# Patient Record
Sex: Male | Born: 1955 | Race: Black or African American | Hispanic: No | Marital: Single | Smoking: Former smoker
Health system: Southern US, Community
[De-identification: ages and names within clinical notes are randomized; demographics above are authoritative.]

## PROBLEM LIST (undated history)

## (undated) DIAGNOSIS — I10 Essential (primary) hypertension: Secondary | ICD-10-CM

## (undated) DIAGNOSIS — N183 Chronic kidney disease, stage 3 unspecified: Secondary | ICD-10-CM

## (undated) DIAGNOSIS — L97509 Non-pressure chronic ulcer of other part of unspecified foot with unspecified severity: Secondary | ICD-10-CM

## (undated) DIAGNOSIS — E11621 Type 2 diabetes mellitus with foot ulcer: Secondary | ICD-10-CM

## (undated) DIAGNOSIS — I509 Heart failure, unspecified: Secondary | ICD-10-CM

## (undated) DIAGNOSIS — D649 Anemia, unspecified: Secondary | ICD-10-CM

## (undated) HISTORY — DX: Type 2 diabetes mellitus with foot ulcer: E11.621

## (undated) HISTORY — DX: Non-pressure chronic ulcer of other part of unspecified foot with unspecified severity: L97.509

## (undated) HISTORY — DX: Chronic kidney disease, stage 3 (moderate): N18.3

## (undated) HISTORY — DX: Chronic kidney disease, stage 3 unspecified: N18.30

## (undated) HISTORY — DX: Essential (primary) hypertension: I10

## (undated) HISTORY — DX: Anemia, unspecified: D64.9

## (undated) HISTORY — PX: CARDIAC CATHETERIZATION: SHX172

## (undated) HISTORY — DX: Heart failure, unspecified: I50.9

---

## 2010-06-18 ENCOUNTER — Encounter (INDEPENDENT_AMBULATORY_CARE_PROVIDER_SITE_OTHER): Payer: Self-pay | Admitting: Internal Medicine

## 2010-06-18 ENCOUNTER — Ambulatory Visit: Payer: Self-pay | Admitting: Cardiology

## 2010-06-18 ENCOUNTER — Inpatient Hospital Stay (HOSPITAL_COMMUNITY): Admission: EM | Admit: 2010-06-18 | Discharge: 2010-06-25 | Payer: Self-pay | Admitting: Emergency Medicine

## 2010-06-19 ENCOUNTER — Encounter (INDEPENDENT_AMBULATORY_CARE_PROVIDER_SITE_OTHER): Payer: Self-pay | Admitting: Internal Medicine

## 2010-07-17 ENCOUNTER — Ambulatory Visit: Payer: Self-pay | Admitting: Cardiovascular Disease

## 2010-10-09 LAB — URINALYSIS, ROUTINE W REFLEX MICROSCOPIC
Glucose, UA: >1000 mg/dL — AB
Hgb urine dipstick: NEGATIVE
Leukocytes, UA: NEGATIVE
Specific Gravity, Urine: >1.046 — ABNORMAL HIGH (ref 1.005–1.030)
Urobilinogen, UA: 2 mg/dL — ABNORMAL HIGH (ref 0.0–1.0)

## 2010-10-09 LAB — GLUCOSE, CAPILLARY
Glucose-Capillary: 100 mg/dL — ABNORMAL HIGH (ref 70–99)
Glucose-Capillary: 114 mg/dL — ABNORMAL HIGH (ref 70–99)
Glucose-Capillary: 114 mg/dL — ABNORMAL HIGH (ref 70–99)
Glucose-Capillary: 131 mg/dL — ABNORMAL HIGH (ref 70–99)
Glucose-Capillary: 141 mg/dL — ABNORMAL HIGH (ref 70–99)
Glucose-Capillary: 169 mg/dL — ABNORMAL HIGH (ref 70–99)
Glucose-Capillary: 170 mg/dL — ABNORMAL HIGH (ref 70–99)
Glucose-Capillary: 178 mg/dL — ABNORMAL HIGH (ref 70–99)
Glucose-Capillary: 192 mg/dL — ABNORMAL HIGH (ref 70–99)
Glucose-Capillary: 193 mg/dL — ABNORMAL HIGH (ref 70–99)
Glucose-Capillary: 199 mg/dL — ABNORMAL HIGH (ref 70–99)
Glucose-Capillary: 254 mg/dL — ABNORMAL HIGH (ref 70–99)
Glucose-Capillary: 340 mg/dL — ABNORMAL HIGH (ref 70–99)

## 2010-10-09 LAB — DIFFERENTIAL
Basophils Absolute: 0 10*3/uL (ref 0.0–0.1)
Basophils Absolute: 0 10*3/uL (ref 0.0–0.1)
Basophils Relative: 0 % (ref 0–1)
Basophils Relative: 0 % (ref 0–1)
Eosinophils Absolute: 0.3 10*3/uL (ref 0.0–0.7)
Eosinophils Relative: 0 % (ref 0–5)
Lymphocytes Relative: 10 % — ABNORMAL LOW (ref 12–46)
Lymphocytes Relative: 8 % — ABNORMAL LOW (ref 12–46)
Monocytes Relative: 4 % (ref 3–12)
Neutro Abs: 10.6 10*3/uL — ABNORMAL HIGH (ref 1.7–7.7)
Neutro Abs: 9.5 10*3/uL — ABNORMAL HIGH (ref 1.7–7.7)
Neutrophils Relative %: 79 % — ABNORMAL HIGH (ref 43–77)
Neutrophils Relative %: 80 % — ABNORMAL HIGH (ref 43–77)
Neutrophils Relative %: 88 % — ABNORMAL HIGH (ref 43–77)

## 2010-10-09 LAB — BASIC METABOLIC PANEL
BUN: 16 mg/dL (ref 6–23)
BUN: 16 mg/dL (ref 6–23)
CO2: 24 mEq/L (ref 19–32)
CO2: 27 mEq/L (ref 19–32)
CO2: 27 mEq/L (ref 19–32)
Calcium: 7.7 mg/dL — ABNORMAL LOW (ref 8.4–10.5)
Calcium: 8.3 mg/dL — ABNORMAL LOW (ref 8.4–10.5)
Calcium: 8.5 mg/dL (ref 8.4–10.5)
Calcium: 8.7 mg/dL (ref 8.4–10.5)
Chloride: 100 mEq/L (ref 96–112)
Chloride: 101 mEq/L (ref 96–112)
Chloride: 104 mEq/L (ref 96–112)
Chloride: 105 mEq/L (ref 96–112)
Chloride: 93 mEq/L — ABNORMAL LOW (ref 96–112)
Creatinine, Ser: 1.07 mg/dL (ref 0.4–1.5)
Creatinine, Ser: 1.19 mg/dL (ref 0.4–1.5)
Creatinine, Ser: 1.23 mg/dL (ref 0.4–1.5)
Creatinine, Ser: 1.32 mg/dL (ref 0.4–1.5)
GFR calc Af Amer: >60 mL/min (ref >60)
GFR calc Af Amer: >60 mL/min (ref >60)
GFR calc Af Amer: >60 mL/min (ref >60)
GFR calc Af Amer: >60 mL/min (ref >60)
GFR calc non Af Amer: 58 mL/min — ABNORMAL LOW (ref >60)
Glucose, Bld: 187 mg/dL — ABNORMAL HIGH (ref 70–99)
Potassium: 3.7 mEq/L (ref 3.5–5.1)
Potassium: 4.1 mEq/L (ref 3.5–5.1)
Sodium: 136 mEq/L (ref 135–145)
Sodium: 137 mEq/L (ref 135–145)
Sodium: 138 mEq/L (ref 135–145)

## 2010-10-09 LAB — RENAL FUNCTION PANEL
Albumin: 2.3 g/dL — ABNORMAL LOW (ref 3.5–5.2)
BUN: 24 mg/dL — ABNORMAL HIGH (ref 6–23)
Creatinine, Ser: 1.21 mg/dL (ref 0.4–1.5)
Phosphorus: 3.5 mg/dL (ref 2.3–4.6)

## 2010-10-09 LAB — COMPREHENSIVE METABOLIC PANEL
ALT: 15 U/L (ref 0–53)
Alkaline Phosphatase: 72 U/L (ref 39–117)
BUN: 29 mg/dL — ABNORMAL HIGH (ref 6–23)
CO2: 28 mEq/L (ref 19–32)
Chloride: 97 mEq/L (ref 96–112)
GFR calc non Af Amer: 46 mL/min — ABNORMAL LOW (ref >60)
Glucose, Bld: 134 mg/dL — ABNORMAL HIGH (ref 70–99)
Potassium: 3.3 mEq/L — ABNORMAL LOW (ref 3.5–5.1)
Sodium: 136 mEq/L (ref 135–145)
Total Bilirubin: 0.9 mg/dL (ref 0.3–1.2)

## 2010-10-09 LAB — CBC
HCT: 29.5 % — ABNORMAL LOW (ref 39.0–52.0)
HCT: 31.1 % — ABNORMAL LOW (ref 39.0–52.0)
HCT: 32.4 % — ABNORMAL LOW (ref 39.0–52.0)
HCT: 34 % — ABNORMAL LOW (ref 39.0–52.0)
Hemoglobin: 10.1 g/dL — ABNORMAL LOW (ref 13.0–17.0)
Hemoglobin: 11.1 g/dL — ABNORMAL LOW (ref 13.0–17.0)
Hemoglobin: 9.5 g/dL — ABNORMAL LOW (ref 13.0–17.0)
MCH: 28 pg (ref 26.0–34.0)
MCH: 28.7 pg (ref 26.0–34.0)
MCH: 28.9 pg (ref 26.0–34.0)
MCHC: 31.4 g/dL (ref 30.0–36.0)
MCHC: 32.5 g/dL (ref 30.0–36.0)
MCV: 87.8 fL (ref 78.0–100.0)
MCV: 87.9 fL (ref 78.0–100.0)
MCV: 88.4 fL (ref 78.0–100.0)
MCV: 88.5 fL (ref 78.0–100.0)
MCV: 89 fL (ref 78.0–100.0)
Platelets: 259 10*3/uL (ref 150–400)
Platelets: 287 10*3/uL (ref 150–400)
Platelets: 291 10*3/uL (ref 150–400)
Platelets: 295 10*3/uL (ref 150–400)
Platelets: 301 10*3/uL (ref 150–400)
Platelets: 330 10*3/uL (ref 150–400)
RBC: 3.36 MIL/uL — ABNORMAL LOW (ref 4.22–5.81)
RBC: 3.5 MIL/uL — ABNORMAL LOW (ref 4.22–5.81)
RBC: 3.51 MIL/uL — ABNORMAL LOW (ref 4.22–5.81)
RBC: 3.66 MIL/uL — ABNORMAL LOW (ref 4.22–5.81)
RBC: 3.87 MIL/uL — ABNORMAL LOW (ref 4.22–5.81)
RDW: 12.6 % (ref 11.5–15.5)
RDW: 12.7 % (ref 11.5–15.5)
RDW: 12.7 % (ref 11.5–15.5)
RDW: 12.7 % (ref 11.5–15.5)
WBC: 10.3 10*3/uL (ref 4.0–10.5)
WBC: 12 10*3/uL — ABNORMAL HIGH (ref 4.0–10.5)
WBC: 13.4 10*3/uL — ABNORMAL HIGH (ref 4.0–10.5)
WBC: 7.4 10*3/uL (ref 4.0–10.5)
WBC: 7.9 10*3/uL (ref 4.0–10.5)

## 2010-10-09 LAB — IRON AND TIBC
Saturation Ratios: 10 % — ABNORMAL LOW (ref 20–55)
TIBC: 177 ug/dL — ABNORMAL LOW (ref 215–435)

## 2010-10-09 LAB — CK TOTAL AND CKMB (NOT AT ARMC)
CK, MB: 3.5 ng/mL (ref 0.3–4.0)
Relative Index: 1.4 (ref 0.0–2.5)
Total CK: 257 U/L — ABNORMAL HIGH (ref 7–232)

## 2010-10-09 LAB — CULTURE, BLOOD (ROUTINE X 2): Culture: NO GROWTH

## 2010-10-09 LAB — APTT: aPTT: 29 seconds (ref 24–37)

## 2010-10-09 LAB — HEPARIN LEVEL (UNFRACTIONATED)
Heparin Unfractionated: 0.12 IU/mL — ABNORMAL LOW (ref 0.30–0.70)
Heparin Unfractionated: 0.41 IU/mL (ref 0.30–0.70)

## 2010-10-09 LAB — MRSA PCR SCREENING: MRSA by PCR: NEGATIVE

## 2010-10-09 LAB — URINE MICROSCOPIC-ADD ON

## 2010-10-09 LAB — LIPID PANEL
LDL Cholesterol: 62 mg/dL (ref 0–99)
Total CHOL/HDL Ratio: 3.3 RATIO
Triglycerides: 59 mg/dL (ref <150)
VLDL: 12 mg/dL (ref 0–40)

## 2010-10-09 LAB — RAPID URINE DRUG SCREEN, HOSP PERFORMED
Amphetamines: NOT DETECTED
Barbiturates: NOT DETECTED

## 2010-10-09 LAB — MAGNESIUM: Magnesium: 1.5 mg/dL (ref 1.5–2.5)

## 2010-10-09 LAB — TROPONIN I
Troponin I: 0.04 ng/mL (ref 0.00–0.06)
Troponin I: 0.1 ng/mL — ABNORMAL HIGH (ref 0.00–0.06)

## 2010-10-09 LAB — PROTIME-INR
INR: 1.36 (ref 0.00–1.49)
Prothrombin Time: 15.5 seconds — ABNORMAL HIGH (ref 11.6–15.2)

## 2010-10-09 LAB — RETICULOCYTES
Retic Count, Absolute: 42.1 10*3/uL (ref 19.0–186.0)
Retic Ct Pct: 1.2 % (ref 0.4–3.1)

## 2010-10-09 LAB — VITAMIN B12: Vitamin B-12: 862 pg/mL (ref 211–911)

## 2010-10-09 LAB — D-DIMER, QUANTITATIVE: D-Dimer, Quant: 1.07 ug/mL-FEU — ABNORMAL HIGH (ref 0.00–0.48)

## 2010-10-09 LAB — CARDIAC PANEL(CRET KIN+CKTOT+MB+TROPI)
Relative Index: 1.1 (ref 0.0–2.5)
Total CK: 321 U/L — ABNORMAL HIGH (ref 7–232)

## 2010-10-09 LAB — VANCOMYCIN, TROUGH: Vancomycin Tr: 14.8 ug/mL (ref 10.0–20.0)

## 2011-11-19 ENCOUNTER — Encounter: Payer: Self-pay | Admitting: *Deleted

## 2012-09-07 IMAGING — CR DG CHEST 2V
2 series · 2 of 2 positions shown · non-contrast
Comparison: 06/17/2010

CLINICAL DATA: CHF

CHEST - 2 VIEW

[w chest pa]
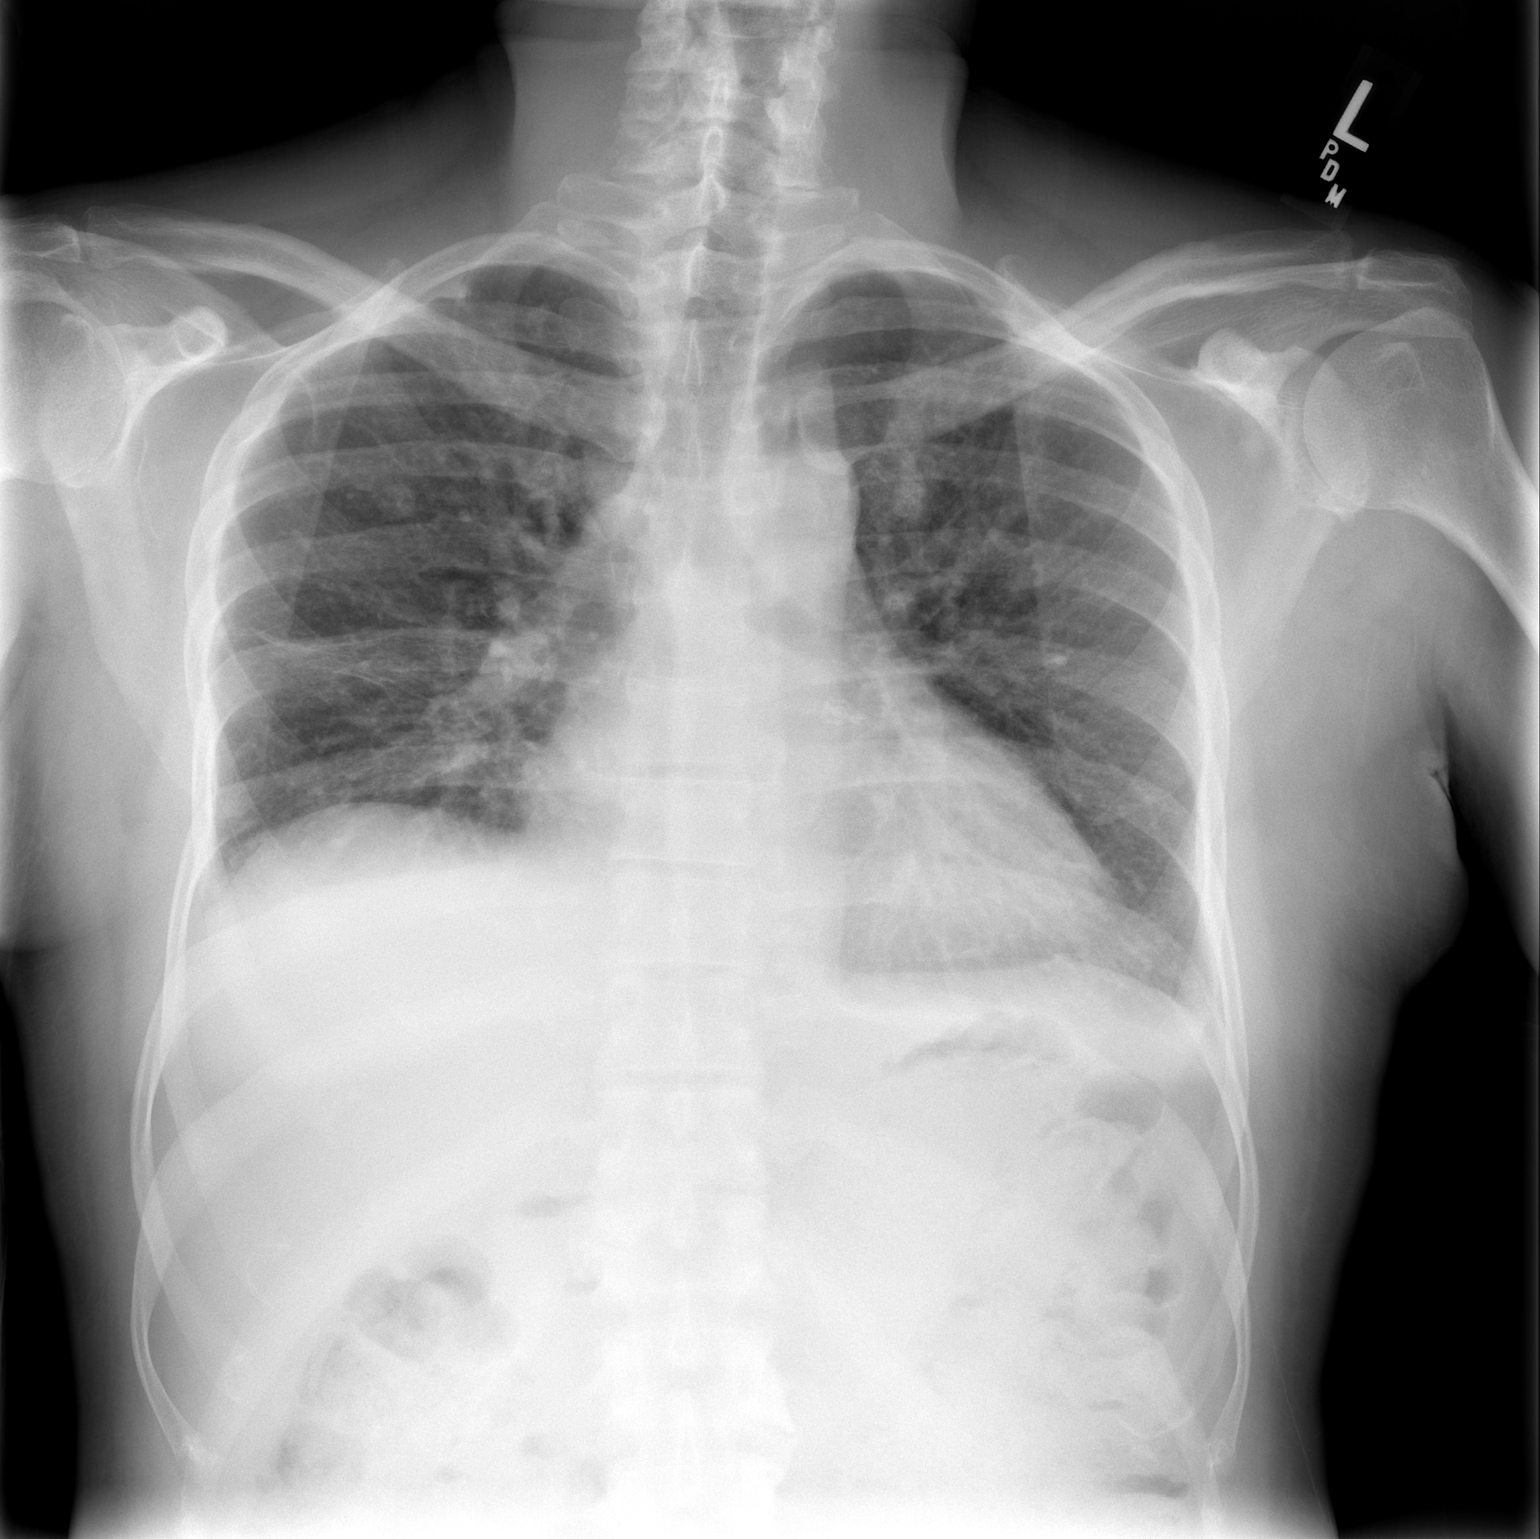

[w chest lat]
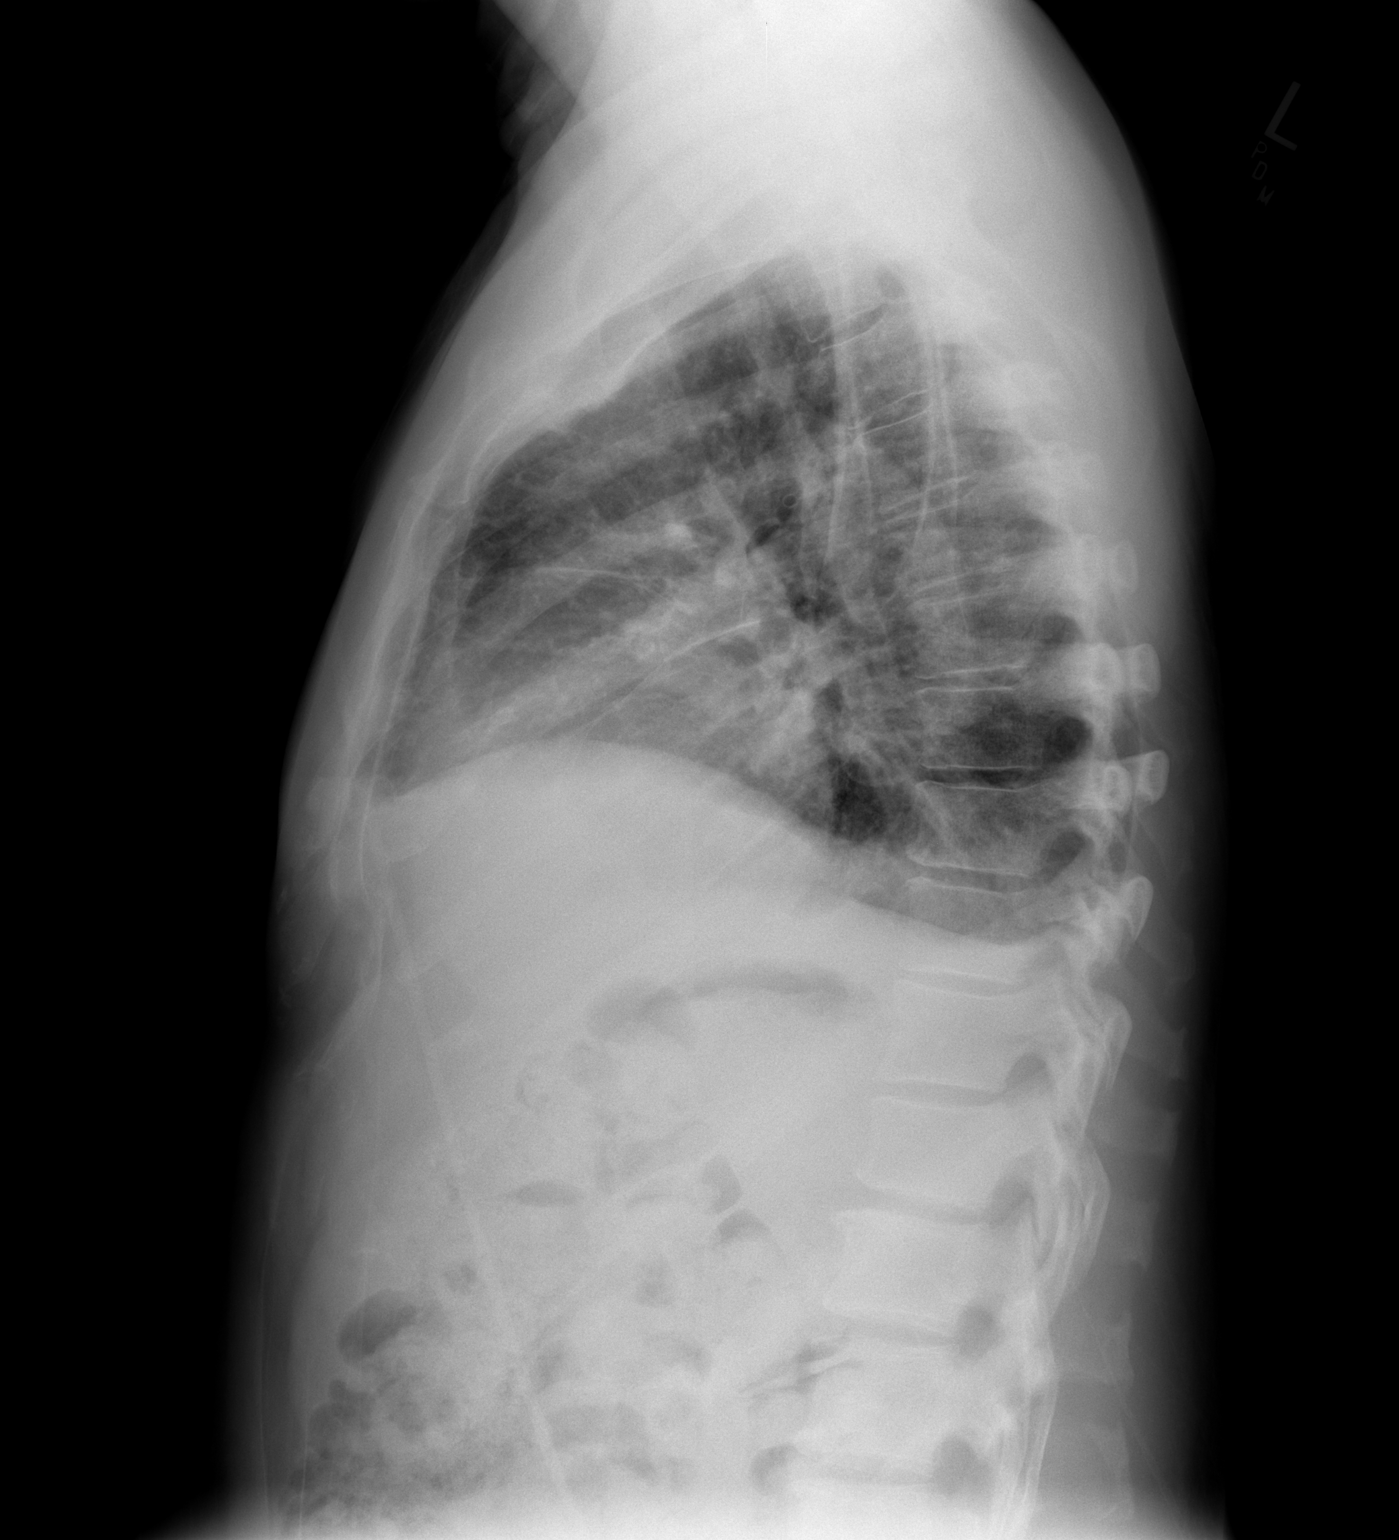

[2 of 2 positions shown; findings below may reference images not displayed]

FINDINGS: The heart is mildly enlarged.  Pulmonary edema and low
volumes have improved.  Tiny pleural effusions are stable.  No
pneumothorax.
IMPRESSION: Improved edema.

## 2014-08-07 ENCOUNTER — Emergency Department (HOSPITAL_COMMUNITY)
Admission: EM | Admit: 2014-08-07 | Discharge: 2014-08-29 | Disposition: E | Payer: Self-pay | Attending: Emergency Medicine | Admitting: Emergency Medicine

## 2014-08-07 ENCOUNTER — Encounter (HOSPITAL_COMMUNITY): Payer: Self-pay | Admitting: Physical Medicine and Rehabilitation

## 2014-08-07 DIAGNOSIS — Z862 Personal history of diseases of the blood and blood-forming organs and certain disorders involving the immune mechanism: Secondary | ICD-10-CM | POA: Insufficient documentation

## 2014-08-07 DIAGNOSIS — N183 Chronic kidney disease, stage 3 (moderate): Secondary | ICD-10-CM | POA: Insufficient documentation

## 2014-08-07 DIAGNOSIS — Z7982 Long term (current) use of aspirin: Secondary | ICD-10-CM | POA: Insufficient documentation

## 2014-08-07 DIAGNOSIS — E119 Type 2 diabetes mellitus without complications: Secondary | ICD-10-CM | POA: Insufficient documentation

## 2014-08-07 DIAGNOSIS — Z9889 Other specified postprocedural states: Secondary | ICD-10-CM | POA: Insufficient documentation

## 2014-08-07 DIAGNOSIS — Z794 Long term (current) use of insulin: Secondary | ICD-10-CM | POA: Insufficient documentation

## 2014-08-07 DIAGNOSIS — Z87891 Personal history of nicotine dependence: Secondary | ICD-10-CM | POA: Insufficient documentation

## 2014-08-07 DIAGNOSIS — Z79899 Other long term (current) drug therapy: Secondary | ICD-10-CM | POA: Insufficient documentation

## 2014-08-07 DIAGNOSIS — Z872 Personal history of diseases of the skin and subcutaneous tissue: Secondary | ICD-10-CM | POA: Insufficient documentation

## 2014-08-07 DIAGNOSIS — I129 Hypertensive chronic kidney disease with stage 1 through stage 4 chronic kidney disease, or unspecified chronic kidney disease: Secondary | ICD-10-CM | POA: Insufficient documentation

## 2014-08-07 DIAGNOSIS — I469 Cardiac arrest, cause unspecified: Secondary | ICD-10-CM | POA: Insufficient documentation

## 2014-08-07 MED ORDER — EPINEPHRINE HCL 0.1 MG/ML IJ SOSY
PREFILLED_SYRINGE | INTRAMUSCULAR | Status: AC | PRN
  Administered 2014-08-07: 1 via INTRAVENOUS

## 2014-08-07 MED ORDER — SODIUM BICARBONATE 8.4 % IV SOLN
INTRAVENOUS | Status: AC | PRN
  Administered 2014-08-07: 50 meq via INTRAVENOUS

## 2014-08-29 NOTE — Progress Notes (Signed)
Chaplain responded to the ED for a CPR of patient.  The patient was unresponsive at the time of arrival to the hospital.  After all medical intervention failed, and there patient was unable be be revived and pronounced.  Family phoned hospital and was informed of his presence in the hospital.  They did not want to come to hospital but was informed by the MD that the patient was deceased and they reported to the hospital. Chaplain offered condolences to family, words of encouragement, comfort measures. They were given an opportunity  to see patient but chose not to. Chaplain informed them of all information needed by the staff which they supplied and left the hospital. They were appreciative of all support by the hospital staff.  Chaplain Janell QuietAudrey Thornton [phone]
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
## 2014-08-29 NOTE — Code Documentation (Signed)
CPR stopped, rhythm check performed, unable to palpate pulses, asystole on cardiac monitor. CPR continued.
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
## 2014-08-29 NOTE — Code Documentation (Signed)
CPR stopped, pulse check performed, unable to palpate pulses, asystole on cardiac monitor, no cardiac activity noted on ultrasound.

## 2014-08-29 NOTE — Code Documentation (Signed)
Time of Death called by Dr. Jeraldine LootsLockwood at 17:40.

## 2014-08-29 DEATH — deceased
